# Patient Record
Sex: Female | Born: 2005 | Race: Black or African American | Hispanic: No | Marital: Single
Health system: Southern US, Community
[De-identification: ages and names within clinical notes are randomized; demographics above are authoritative.]

## PROBLEM LIST (undated history)

## (undated) HISTORY — PX: SMALL INTESTINE SURGERY: SHX150

---
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
## 2023-04-06 ENCOUNTER — Encounter: Payer: Self-pay | Admitting: Emergency Medicine

## 2023-04-06 ENCOUNTER — Ambulatory Visit
Admission: EM | Admit: 2023-04-06 | Discharge: 2023-04-06 | Disposition: A | Discharge status: Home / Self Care | Payer: PRIVATE HEALTH INSURANCE | Attending: Physician Assistant | Admitting: Physician Assistant

## 2023-04-06 DIAGNOSIS — J101 Influenza due to other identified influenza virus with other respiratory manifestations: Secondary | ICD-10-CM

## 2023-04-06 LAB — POCT INFLUENZA A/B
Influenza A, POC: POSITIVE — AB
Influenza B, POC: NEGATIVE

## 2023-04-06 NOTE — ED Provider Notes (Signed)
[name]    CSN: [id number] Arrival date & time: 04/06/23  1636      History   Chief Complaint Chief Complaint  Patient presents with   Fever   Cough   Sore Throat    HPI Erika Burke is a 18 y.o. female.   Patient complains of a cough and congestion.  Patient reports her boyfriend had a similar illness.  Patient reports that she has been sick for the past 4 days.  Patient denies any chest pain denies any abdominal pain.   Fever Associated symptoms: cough   Cough Associated symptoms: fever   Sore Throat    History reviewed. No pertinent past medical history.  There are no active problems to display for this patient.   Past Surgical History:  Procedure Laterality Date   SMALL INTESTINE SURGERY      OB History   No obstetric history on file.      Home Medications    Prior to Admission medications   Medication Sig Start Date End Date Taking? Authorizing Provider  ibuprofen (ADVIL) 400 MG tablet Take 1 tablet (400 mg total) by mouth every 8 (eight) hours as needed for moderate pain. 07/08/21  Yes Wallis Bamberg, PA-C  camphor-menthol [facility]) lotion Apply 1 application topically as needed for itching. Patient not taking: Reported on 04/06/2023 01/05/20   Belinda Fisher, PA-C  cetirizine (ZYRTEC ALLERGY) 10 MG tablet Take 1 tablet (10 mg total) by mouth daily. Patient not taking: Reported on 04/06/2023 01/05/20   Belinda Fisher, PA-C  fluticasone [facility]) 50 MCG/ACT nasal spray Place 2 sprays into both nostrils daily. Patient not taking: Reported on 04/06/2023 01/05/20   Belinda Fisher, PA-C  ondansetron (ZOFRAN-ODT) 4 MG disintegrating tablet Take 1 tablet (4 mg total) by mouth every 8 (eight) hours as needed for nausea or vomiting. Patient not taking: Reported on 04/06/2023 11/23/22   Gustavus Bryant, FNP  tiZANidine (ZANAFLEX) 4 MG tablet Take 1 tablet (4 mg total) by mouth at bedtime. Patient not taking: Reported on 04/06/2023 07/08/21   Wallis Bamberg, PA-C    Family  History History reviewed. No pertinent family history.  Social History Social History   Tobacco Use   Smoking status: Passive Smoke Exposure - Never Smoker   Smokeless tobacco: Never  Vaping Use   Vaping status: Never Used  Substance Use Topics   Alcohol use: No   Drug use: Never     Allergies   Patient has no known allergies.   Review of Systems Review of Systems  Constitutional:  Positive for fever.  Respiratory:  Positive for cough.   All other systems reviewed and are negative.    Physical Exam Triage Vital Signs ED Triage Vitals  Encounter Vitals Group     BP 04/06/23 1730 110/72     Systolic BP Percentile --      Diastolic BP Percentile --      Pulse Rate 04/06/23 1730 (!) 102     Resp 04/06/23 1730 16     Temp 04/06/23 1730 100 F (37.8 C)     Temp Source 04/06/23 1730 Oral     SpO2 04/06/23 1730 98 %     Weight --      Height --      Head Circumference --      Peak Flow --      Pain Score 04/06/23 1731 7     Pain Loc --      Pain Education --  Exclude from Growth Chart --    No data found.  Updated Vital Signs BP 110/72 (BP Location: Left Arm)   Pulse (!) 102   Temp 100 F (37.8 C) (Oral)   Resp 16   LMP 03/30/2023 (Approximate)   SpO2 98%   Visual Acuity Right Eye Distance:   Left Eye Distance:   Bilateral Distance:    Right Eye Near:   Left Eye Near:    Bilateral Near:     Physical Exam Vitals and nursing note reviewed.  Constitutional:      Appearance: She is well-developed.  HENT:     Head: Normocephalic.     Right Ear: Tympanic membrane normal.     Left Ear: Tympanic membrane normal.     Mouth/Throat:     Mouth: Mucous membranes are moist.  Cardiovascular:     Rate and Rhythm: Normal rate.     Heart sounds: Normal heart sounds.  Pulmonary:     Effort: Pulmonary effort is normal.  Abdominal:     General: There is no distension.  Musculoskeletal:        General: Normal range of motion.     Cervical back: Normal  range of motion.  Skin:    General: Skin is warm.  Neurological:     General: No focal deficit present.     Mental Status: She is alert and oriented to person, place, and time.      UC Treatments / Results  Labs (all labs ordered are listed, but only abnormal results are displayed) Labs Reviewed  POCT INFLUENZA A/B - Abnormal; Notable for the following components:      Result Value   Influenza A, POC Positive (*)    All other components within normal limits    EKG   Radiology No results found.  Procedures Procedures (including critical care time)  Medications Ordered in UC Medications - No data to display  Initial Impression / Assessment and Plan / UC Course  I have reviewed the triage vital signs and the nursing notes.  Pertinent labs & imaging results that were available during my care of the patient were reviewed by me and considered in my medical decision making (see chart for details).      Final Clinical Impressions(s) / UC Diagnoses   Final diagnoses:  Influenza A   Discharge Instructions   None    ED Prescriptions   None    PDMP not reviewed this encounter. An After Visit Summary was printed and given to the patient.       Elson Areas, [place] 04/06/23 1610

## 2023-04-06 NOTE — ED Triage Notes (Signed)
Pt reports fevers, nasal congestion, productive cough, sore throat, body aches, and chills x4 days. No relief with dayquil/nyquil or theraflu at home. Max temp: 102 last night. Requests flu testing.
